# Patient Record
Sex: Female | Born: 1990 | Race: White | Hispanic: No | Marital: Single | Smoking: Never smoker
Health system: Southern US, Community
[De-identification: ages and names within clinical notes are randomized; demographics above are authoritative.]

## PROBLEM LIST (undated history)

## (undated) DIAGNOSIS — R6250 Unspecified lack of expected normal physiological development in childhood: Secondary | ICD-10-CM

## (undated) DIAGNOSIS — F79 Unspecified intellectual disabilities: Secondary | ICD-10-CM

## (undated) HISTORY — DX: Unspecified intellectual disabilities: F79

## (undated) HISTORY — DX: Unspecified lack of expected normal physiological development in childhood: R62.50

---

## 2012-12-24 ENCOUNTER — Ambulatory Visit (INDEPENDENT_AMBULATORY_CARE_PROVIDER_SITE_OTHER): Payer: Medicaid Other

## 2012-12-24 DIAGNOSIS — R55 Syncope and collapse: Secondary | ICD-10-CM

## 2012-12-24 NOTE — Procedures (Signed)
[facility]  EEG (ELECTROENCEPHALOGRAM) REPORT   STUDY DATE: 12/24/2012 PATIENT: Asani Deniston DOB: 01/27/1991 MRN: [id number]  ORDERING CLINICIAN: Donzetta Sprung, MD / Joycelyn Schmid, MD  TECHNOLOGIST: Gearldine Shown TECHNIQUE: Electroencephalogram was recorded utilizing standard 10-20 system of lead placement and reformatted into average and bipolar montages.  RECORDING TIME: 23 minutes ACTIVATION: hyperventilation attempted but patient would not cooperate; photic stimulation was performed  CLINICAL INFORMATION: syncope  FINDINGS: Background rhythms of 9-10 hertz and 60-70 microvolts. No focal, lateralizing, epileptiform activity or seizures are seen. Patient record in the awake and drowsy states.  IMPRESSION: Normal EEG in the awake and drowsy states.   INTERPRETING PHYSICIAN:   Suanne Marker, MD 12/24/2012, 5:48 PM Certified in Neurology, Neurophysiology and Neuroimaging  [facility] [address], [address] [place], [place] [address] [phone]

## 2013-01-07 ENCOUNTER — Encounter: Payer: Self-pay | Admitting: Diagnostic Neuroimaging

## 2013-01-07 ENCOUNTER — Ambulatory Visit (INDEPENDENT_AMBULATORY_CARE_PROVIDER_SITE_OTHER): Payer: Medicaid Other | Admitting: Diagnostic Neuroimaging

## 2013-01-07 VITALS — BP 108/74 | HR 88 | Temp 97.6°F | Ht 60.0 in | Wt 106.5 lb

## 2013-01-07 DIAGNOSIS — R6250 Unspecified lack of expected normal physiological development in childhood: Secondary | ICD-10-CM | POA: Insufficient documentation

## 2013-01-07 DIAGNOSIS — R569 Unspecified convulsions: Secondary | ICD-10-CM

## 2013-01-07 NOTE — Patient Instructions (Addendum)
Check MRI brain. No anti-seizure meds for now. EEG was normal. CT was normal.

## 2013-01-07 NOTE — Progress Notes (Signed)
[facility]  PATIENT: Madison Grant DOB: November 23, 1990  REFERRING CLINICIAN: Daniel HISTORY FROM: patent's mother REASON FOR VISIT: seizure   HISTORICAL  CHIEF COMPLAINT:  Chief Complaint  Patient presents with  . Seizures    HISTORY OF PRESENT ILLNESS:   22 year old right-handed female, born [redacted] weeks gestation, with low birth weight, failure to thrive, developmental delay, here for valuation of new-onset seizure. 11/26/2012, patient was at home with her cousins and siblings, and was not feeling good that day. Apparently she collapsed and passed out. Her mother came into the room and found her passed out, arms extended in front of her with generalized convulsions. No tongue biting or incontinence. Patient was confused afterwards. She was dizzy as well. She went to [facility] for evaluation with CT of the head and lab testing. Patient was diagnosed with urinary tract infection discharged home.  No further events. No prior seizure like episodes. Patient did have history of recurrent syncope earlier in life, one episode every 4-6 weeks. Those episode consisted of loss of consciousness and falling down. No convulsions or postictal confusion. No family history of seizure.  Patient also has history of chronic intermittent nausea and vomiting. Workup so far has been unrevealing.  Patient has been worked up for developmental delay in [place], with neurology and pediatric evaluation, but no specific diagnosis.  REVIEW OF SYSTEMS: Full 14 system review of systems performed and notable only for weight loss, anemia, memory loss, confusion, slurred speech, dizziness, seizure, passing out, depression, anxiety, decreased energy, change in appetite.  ALLERGIES: Not on File  HOME MEDICATIONS: No outpatient prescriptions prior to visit.   No facility-administered medications prior to visit.    PAST MEDICAL HISTORY: Past Medical History  Diagnosis Date  . Mental  disability   . Developmental delay     PAST SURGICAL HISTORY: History reviewed. No pertinent past surgical history.  FAMILY HISTORY: Family History  Problem Relation Age of Onset  . Migraines    . Fibromyalgia    . Rheum arthritis      SOCIAL HISTORY:  History   Social History  . Marital Status: Single    Spouse Name: N/A    Number of Children: 0  . Years of Education: HS   Occupational History  . Not on file.   Social History Main Topics  . Smoking status: Never Smoker   . Smokeless tobacco: Not on file  . Alcohol Use: No  . Drug Use: No  . Sexually Active: Not on file   Other Topics Concern  . Not on file   Social History Narrative   Pt lives at home with her siblings, and 3 cousins.    Caffeine use-none     PHYSICAL EXAM  Filed Vitals:   01/07/13 1205  BP: 108/74  Pulse: 88  Temp: 97.6 F (36.4 C)  TempSrc: Oral  Height: 5' (1.524 m)  Weight: 106 lb 8 oz (48.308 kg)   Body mass index is 20.8 kg/(m^2).  GENERAL EXAM: Patient is in no distress  CARDIOVASCULAR: Regular rate and rhythm, no murmurs, no carotid bruits  NEUROLOGIC: MENTAL STATUS: awake, alert, PLEASANT, DECR FLUENCY, FOLLOWS COMMANDS. SOFT SPOKEN. CRANIAL NERVE: no papilledema on fundoscopic exam, pupils equal and reactive to light, visual fields full to confrontation, extraocular muscles intact, no nystagmus, facial sensation and strength symmetric, uvula midline, shoulder shrug symmetric, tongue midline. MOTOR: normal bulk and tone, full strength in the BUE, BLE SENSORY: normal and symmetric to light touch COORDINATION: finger-nose-finger,  fine finger movements normal REFLEXES: deep tendon reflexes present and symmetric GAIT/STATION: narrow based gait; able to walk tandem; romberg is negative   DIAGNOSTIC DATA (LABS, IMAGING, TESTING) - I reviewed patient records, labs, notes, testing and imaging myself where available.  No results found for this basename: WBC, HGB, HCT, MCV,  PLT   No results found for this basename: na, k, cl, co2, glucose, bun, creatinine, calcium, prot, albumin, ast, alt, alkphos, bilitot, gfrnonaa, gfraa   No results found for this basename: CHOL, HDL, LDLCALC, LDLDIRECT, TRIG, CHOLHDL   No results found for this basename: HGBA1C   No results found for this basename: VITAMINB12   No results found for this basename: TSH   11/26/12 CT HEAD - normal  12/20/12 EEG - normal; 9-10 hertz   ASSESSMENT AND PLAN  22 y.o. year old female  has a past medical history of Mental disability and Developmental delay. here with new onset seizure x 1 on 11/26/12 in the setting of urinary tract infection. CT head, EEG, neurologic examination are unremarkable. I will check MRI of the brain for further evaluation. No antiseizure medication for now. If patient has a second seizure, then I will start antiseizure medication at that point. Patient's mother and patient agree with the plan.  Orders Placed This Encounter  Procedures  . MR Brain W Wo Contrast     Suanne Marker, MD 01/07/2013, 12:30 PM Certified in Neurology, Neurophysiology and Neuroimaging  [facility] [address], [address] [place], [place] [address] [phone]

## 2015-08-15 ENCOUNTER — Emergency Department (HOSPITAL_COMMUNITY)
Admission: EM | Admit: 2015-08-15 | Discharge: 2015-08-15 | Disposition: A | Discharge status: Home / Self Care | Payer: Medicaid Other | Attending: Emergency Medicine | Admitting: Emergency Medicine

## 2015-08-15 ENCOUNTER — Emergency Department (HOSPITAL_COMMUNITY): Payer: Medicaid Other

## 2015-08-15 ENCOUNTER — Encounter (HOSPITAL_COMMUNITY): Payer: Self-pay | Admitting: Emergency Medicine

## 2015-08-15 DIAGNOSIS — S53104A Unspecified dislocation of right ulnohumeral joint, initial encounter: Secondary | ICD-10-CM

## 2015-08-15 DIAGNOSIS — S82401A Unspecified fracture of shaft of right fibula, initial encounter for closed fracture: Secondary | ICD-10-CM | POA: Diagnosis not present

## 2015-08-15 DIAGNOSIS — Y9351 Activity, roller skating (inline) and skateboarding: Secondary | ICD-10-CM | POA: Insufficient documentation

## 2015-08-15 DIAGNOSIS — Y998 Other external cause status: Secondary | ICD-10-CM | POA: Diagnosis not present

## 2015-08-15 DIAGNOSIS — S53024A Posterior dislocation of right radial head, initial encounter: Secondary | ICD-10-CM | POA: Insufficient documentation

## 2015-08-15 DIAGNOSIS — F79 Unspecified intellectual disabilities: Secondary | ICD-10-CM | POA: Diagnosis not present

## 2015-08-15 DIAGNOSIS — Y9289 Other specified places as the place of occurrence of the external cause: Secondary | ICD-10-CM | POA: Insufficient documentation

## 2015-08-15 DIAGNOSIS — G8918 Other acute postprocedural pain: Secondary | ICD-10-CM

## 2015-08-15 DIAGNOSIS — S6991XA Unspecified injury of right wrist, hand and finger(s), initial encounter: Secondary | ICD-10-CM | POA: Diagnosis present

## 2015-08-15 MED ORDER — MORPHINE SULFATE (PF) 2 MG/ML IV SOLN
2.0000 mg | Freq: Once | INTRAVENOUS | Status: AC
  Administered 2015-08-15: 2 mg via INTRAVENOUS
  Filled 2015-08-15: qty 1

## 2015-08-15 MED ORDER — HYDROCODONE-ACETAMINOPHEN 5-325 MG PO TABS: 2.0000 | ORAL_TABLET | Freq: Four times a day (QID) | ORAL | Status: AC | PRN

## 2015-08-15 MED ORDER — PROPOFOL 10 MG/ML IV BOLUS
1.0000 mg/kg | Freq: Once | INTRAVENOUS | Status: AC
  Administered 2015-08-15: 40 mg via INTRAVENOUS
  Filled 2015-08-15: qty 20

## 2015-08-15 MED ORDER — FREE SPIRIT KNEE/LEG WALKER MISC: 1.0000 | Freq: Once | Status: AC

## 2015-08-15 MED ORDER — SODIUM CHLORIDE 0.9 % IV SOLN
INTRAVENOUS | Status: AC | PRN
  Administered 2015-08-15: 150 mL/h via INTRAVENOUS

## 2015-08-15 MED ORDER — PROPOFOL 10 MG/ML IV BOLUS
INTRAVENOUS | Status: AC | PRN
  Administered 2015-08-15: 20 mg via INTRAVENOUS

## 2015-08-15 NOTE — ED Notes (Addendum)
Fell [place] skating rink; reports right elbow pain. EMS reports severe pain with movement and seemed concave at joint. Immobilized by EMS. 4mg  Morphine given in route. Patient has mental disability with mental abilities of about 24 years old; mother is at bedside with patient. Also reports pain in the right ankle, swelling noted.

## 2015-08-15 NOTE — ED Provider Notes (Signed)
CSN: [id number]     Arrival date & time 08/15/15  1556 History   First MD Initiated Contact with Patient 08/15/15 1621     Chief Complaint  Patient presents with  . Arm Injury     (Consider location/radiation/quality/duration/timing/severity/associated sxs/prior Treatment) HPI   Madison Grant is a 24 y.o. female with PMH significant for mental disability and developmental delay who presents with fall.  Patient was skating earlier this afternoon and lost her balance falling on outstretched right arm.  Denies LOC, syncope, dizziness, lightheadedness at the time of the fall.  Denies head injury.  She is complaining of severe persistent right elbow pain and right ankle pain.  She denies numbness, tingling, or weakness.  No other complaints at this time.  She received 4 mg PTA per EMS.  It is made worse with movement.   Past Medical History  Diagnosis Date  . Mental disability   . Developmental delay    History reviewed. No pertinent past surgical history. Family History  Problem Relation Age of Onset  . Migraines    . Fibromyalgia    . Rheum arthritis     Social History  Substance Use Topics  . Smoking status: Never Smoker   . Smokeless tobacco: None  . Alcohol Use: No   OB History    No data available     Review of Systems All other systems negative unless otherwise stated in HPI    Allergies  Review of patient's allergies indicates no known allergies.  Home Medications   Prior to Admission medications   Medication Sig Start Date End Date Taking? Authorizing Provider  HYDROcodone-acetaminophen (NORCO/VICODIN) 5-325 MG tablet Take 2 tablets by mouth every 6 (six) hours as needed. 08/15/15   Cheri FowlerKayla Buffie Herne, PA-C  Misc. Devices (FREE SPIRIT KNEE/LEG WALKER) MISC 1 Device by Does not apply route once. 08/15/15   Lyndal Pulleyaniel Knott, MD   BP 118/70 mmHg  Pulse 88  Temp(Src) 98.4 F (36.9 C) (Oral)  Resp 18  SpO2 99%  LMP 08/02/2015 (Approximate) Physical Exam  Constitutional:  She is oriented to person, place, and time. She appears well-developed and well-nourished.  HENT:  Head: Normocephalic and atraumatic.  Mouth/Throat: Oropharynx is clear and moist.  Eyes: Conjunctivae are normal.  Neck: Normal range of motion. Neck supple.  Cardiovascular: Normal rate, regular rhythm and normal heart sounds.   No murmur heard. Pulses:      Radial pulses are 2+ on the right side, and 2+ on the left side.       Dorsalis pedis pulses are 2+ on the right side, and 2+ on the left side.  Capillary refill less than 3 seconds distal to injuries.   Pulmonary/Chest: Effort normal and breath sounds normal. No accessory muscle usage or stridor. No respiratory distress. She has no wheezes. She has no rhonchi. She has no rales.  Abdominal: Soft. Bowel sounds are normal. She exhibits no distension. There is no tenderness.  Musculoskeletal:       Right elbow: She exhibits decreased range of motion, swelling and deformity. Tenderness found.       Left elbow: Normal.       Right ankle: She exhibits decreased range of motion and swelling. She exhibits no ecchymosis, no deformity, no laceration and normal pulse. Tenderness. Achilles tendon normal.       Left ankle: Normal.  Compartments are soft and compressible.   Lymphadenopathy:    She has no cervical adenopathy.  Neurological: She is alert and oriented to  person, place, and time.  Speech clear without dysarthria.  Sensation intact distal to injuries.  5/5 grip strength bilaterally.  Decreased strength in right ankle secondary to pain.  She is able to move all 5 digits of the right foot.   Skin: Skin is warm and dry.  Psychiatric: She has a normal mood and affect. Her behavior is normal.    ED Course  Procedures (including critical care time) Labs Review Labs Reviewed - No data to display  Imaging Review Dg Elbow 2 Views Right  08/15/2015  CLINICAL DATA:  Postreduction. EXAM: RIGHT ELBOW - 2 VIEW COMPARISON:  Level radiographs from  the same day. FINDINGS: The elbow has been reduced. A moderate-sized joint effusion remains. No focal fracture is evident. IMPRESSION: 1. Reduction of the dislocation. 2. Moderate-sized joint effusion persist. 3. No acute fracture. Electronically Signed   By: Marin Roberts M.D.   On: 08/15/2015 20:28   Dg Elbow Complete Right  08/15/2015  CLINICAL DATA:  Elbow pain after a fall while roller-skating today. EXAM: RIGHT ELBOW - COMPLETE 3+ VIEW COMPARISON:  None. FINDINGS: There is posterior dislocation of the proximal radius and ulna with over riding of the bones. However, there is no visible fracture. IMPRESSION: Dislocation of the radius and ulna at the elbow joint as described. Electronically Signed   By: Francene Boyers M.D.   On: 08/15/2015 17:26   Dg Ankle Complete Right  08/15/2015  CLINICAL DATA:  Ankle pain secondary to a twisting fall on roller-skates today. EXAM: RIGHT ANKLE - COMPLETE 3+ VIEW COMPARISON:  None. FINDINGS: There is a slightly displaced spiral fracture of the distal shaft of the right fibula. No other abnormality. IMPRESSION: Spiral fracture of distal right fibula shaft. Electronically Signed   By: Francene Boyers M.D.   On: 08/15/2015 17:25   I have personally reviewed and evaluated these images and lab results as part of my medical decision-making.   EKG Interpretation None      MDM   Final diagnoses:  Post procedure discomfort  Elbow dislocation, right, initial encounter  Fibula fracture, right, closed, initial encounter    Patient presents after mechanical while skating landing on her right ankle and elbow.  VSS, NAD.  Heart RRR, lungs CTAB, abdomen soft and nontender.  Right elbow with deformity and swelling, limited ROM.  Right ankle swelling and TTP medially and laterally.  Neurovascularly intact distal to both injuries.    Plain films of right elbow shows dislocation of radius and ulna.  Plain films of ankle shows spiral fx of distal right fibula.  Will  perform reduction of elbow and apply splint to ankle.  Post reduction plain films of right elbow show reduction of dislocation and moderate joint effusion, no signs of fracture.  Will place in sling.   Prescription for scooter from medical supplies store.  Case manager not available at this time. Consult ordered. Patient to return to ED to speak with case management if unable to obtain scooter.  Follow up ortho.  Norco for pain management.  Evaluation does not show pathology requring ongoing emergent intervention or admission. Pt is hemodynamically stable and mentating appropriately. Discussed findings/results and plan with patient/guardian, who agrees with plan. All questions answered. Return precautions discussed and outpatient follow up given.   Case has been discussed with and seen by Dr. Clydene Pugh who agrees with the above plan for discharge.   Cheri Fowler, PA-C 08/15/15 2300  Lyndal Pulley, MD 08/18/15 [phone]

## 2015-08-15 NOTE — Progress Notes (Signed)
Orthopedic Tech Progress Note Patient Details:  Madison ConradiVivian Grant 11-Apr-1991 [id number] Applied sling immobilizer to RUE.  Applied fiberglass posterior short leg splint and fiberglass stirrup splint to RLE.  Pedal pulses, sensation, motion intact before and after splinting.  Capillary refill less than 2 seconds before and after splinting. Ortho Devices Type of Ortho Device: Sling immobilizer, Stirrup splint, Post (short leg) splint Ortho Device/Splint Location: Sling to RUE.  Splint to RLE. Ortho Device/Splint Interventions: Application   Lesle ChrisGilliland, Betheny Suchecki L 08/15/2015, 9:39 PM

## 2015-08-15 NOTE — ED Notes (Signed)
Patient returned from X-ray 

## 2015-08-15 NOTE — ED Notes (Signed)
Ortho tech at the bedside.  

## 2015-08-15 NOTE — Discharge Instructions (Signed)
Cast or Splint Care °Casts and splints support injured limbs and keep bones from moving while they heal. It is important to care for your cast or splint at home.   °HOME CARE INSTRUCTIONS °· Keep the cast or splint uncovered during the drying period. It can take 24 to 48 hours to dry if it is made of plaster. A fiberglass cast will dry in less than 1 hour. °· Do not rest the cast on anything harder than a pillow for the first 24 hours. °· Do not put weight on your injured limb or apply pressure to the cast until your health care provider gives you permission. °· Keep the cast or splint dry. Wet casts or splints can lose their shape and may not support the limb as well. A wet cast that has lost its shape can also create harmful pressure on your skin when it dries. Also, wet skin can become infected. °· Cover the cast or splint with a plastic bag when bathing or when out in the rain or snow. If the cast is on the trunk of the body, take sponge baths until the cast is removed. °· If your cast does become wet, dry it with a towel or a blow dryer on the cool setting only. °· Keep your cast or splint clean. Soiled casts may be wiped with a moistened cloth. °· Do not place any hard or soft foreign objects under your cast or splint, such as cotton, toilet paper, lotion, or powder. °· Do not try to scratch the skin under the cast with any object. The object could get stuck inside the cast. Also, scratching could lead to an infection. If itching is a problem, use a blow dryer on a cool setting to relieve discomfort. °· Do not trim or cut your cast or remove padding from inside of it. °· Exercise all joints next to the injury that are not immobilized by the cast or splint. For example, if you have a long leg cast, exercise the hip joint and toes. If you have an arm cast or splint, exercise the shoulder, elbow, thumb, and fingers. °· Elevate your injured arm or leg on 1 or 2 pillows for the first 1 to 3 days to decrease  swelling and pain. It is best if you can comfortably elevate your cast so it is higher than your heart. °SEEK MEDICAL CARE IF:  °· Your cast or splint cracks. °· Your cast or splint is too tight or too loose. °· You have unbearable itching inside the cast. °· Your cast becomes wet or develops a soft spot or area. °· You have a bad smell coming from inside your cast. °· You get an object stuck under your cast. °· Your skin around the cast becomes red or raw. °· You have new pain or worsening pain after the cast has been applied. °SEEK IMMEDIATE MEDICAL CARE IF:  °· You have fluid leaking through the cast. °· You are unable to move your fingers or toes. °· You have discolored (blue or white), cool, painful, or very swollen fingers or toes beyond the cast. °· You have tingling or numbness around the injured area. °· You have severe pain or pressure under the cast. °· You have any difficulty with your breathing or have shortness of breath. °· You have chest pain. °  °This information is not intended to replace advice given to you by your health care provider. Make sure you discuss any questions you have with your health care   provider.   Document Released: 09/22/2000 Document Revised: 07/16/2013 Document Reviewed: 04/03/2013 [employer] Interactive Patient Education 2016 [employer]  Elbow Dislocation Elbow dislocation is the displacement of the bones that form the elbow joint. Three bones come together to form the elbow. The humerus is the bone in the upper arm. The radius and ulna are the 2 bones in the forearm that form the lower part of the elbow. The elbow is held in place by very strong, fibrous tissues (ligaments) that connect the bones to each other. CAUSES Elbow dislocations are not common. Typically, they occur when a person falls forward with hands and elbows outstretched. The force of the impact is sent to the elbow. Usually, there is a twisting motion in this force. Elbow dislocations also happen  during car crashes when passengers reach out to brace themselves during the impact. RISK FACTORS Although dislocation of the elbow can happen to anyone, some people are at greater risk than others. People at increased risk of elbow dislocation include:  People born with greater looseness in their ligaments.  People born with an ulna bone that has a shallow groove for the elbow hinge joint. SYMPTOMS Symptoms of a complete elbow dislocation usually are obvious. They include extreme pain and the appearance of a deformed arm.  Symptoms of a partial dislocation may not be obvious. Your elbow may move somewhat, but you may have pain and swelling. Also, there will likely be bruising on the inside and outside of your elbow where ligaments have been stretched or torn.  DIAGNOSIS  To diagnose elbow dislocation, your caregiver will perform a physical exam. During this exam, your caregiver will check your arm for tenderness, swelling, and deformity. The skin around your arm and the circulation in your arm also will be checked. Your pulse will be checked at your wrist. If your artery is injured during dislocation, your hand will be cool to the touch and may be white or purple in color. Your caregiver also may check your arm and your ability to move your wrist and fingers to see if you had any damage to your nerves during dislocation. An X-ray exam also may be done to determine if there is bone injury. Results of an X-ray exam can help show the direction of the dislocation. If you have a simple dislocation, there is no major bone injury. If you have a complex dislocation, you may have broken bones (fractures) associated with the ligament injuries. TREATMENT For a simple elbow dislocation, your bones can usually be realigned in a procedure called a reduction. This is a treatment in which your bones are manually moved back into place either with the use of numbing medicine (regional anesthetic) around your elbow or  medicine to make you sleep (general anesthetic). Then your elbow is kept immobile with a sling or a splint for 2 to 3 weeks. This is followed with physical therapy to help your joint move again. Complex elbow dislocation may require surgery to restore joint alignment and repair ligaments. After surgery, your elbow may be protected with an external hinge. This device keeps your elbow from dislocating again while motion exercises are done. Additional surgery may be needed to repair any injuries to blood vessels and nerves or bones and ligaments or to relieve pressure from excessive swelling around the muscles. HOME CARE INSTRUCTIONS The following measures can help to reduce pain and hasten the healing process:  Rest your injured joint. Do not move it. Avoid activities similar to the one that  caused your injury.  Exercise your hand and fingers as instructed by your caregiver.  Apply ice to your injured joint for 1 to 2 days after your reduction or as directed by your caregiver. Applying ice helps to reduce inflammation and pain.  Put ice in a plastic bag.  Place a towel between your skin and the bag.  Leave the ice on for 15 to 20 minutes at a time, every couple of hours while you are awake.  Elevate your arm above your heart and move your wrist and fingers as instructed by your caregiver to help limit swelling.  Take over-the-counter or prescription medicines for pain as directed by your caregiver. SEEK IMMEDIATE MEDICAL CARE IF:  Your splint becomes damaged.  You have an external hinge and it becomes loose or will not move.  You have an external hinge and you develop drainage around the pins.  Your pain becomes worse rather than better.  You lose feeling in your hand or fingers. MAKE SURE YOU:  Understand these instructions.  Will watch your condition.  Will get help right away if you are not doing well or get worse.   This information is not intended to replace advice given to  you by your health care provider. Make sure you discuss any questions you have with your health care provider.   Document Released: 09/19/2001 Document Revised: 10/16/2014 Document Reviewed: 05/10/2015 [employer] Interactive Patient Education 2016 [employer]  Fibular Ankle Fracture Treated With or Without Immobilization, Adult A fibular fracture at your ankle is a break (fracture) bone in the smallest of the two bones in your lower leg, located on the outside of your leg (fibula) close to the area at your ankle joint. CAUSES  Rolling your ankle.  Twisting your ankle.  Extreme flexing or extending of your foot.  Severe force on your ankle as when falling from a distance. RISK FACTORS  Jumping activities.  Participation in sports.  Osteoporosis.  Advanced age.  Previous ankle injuries. SIGNS AND SYMPTOMS  Pain.  Swelling.  Inability to put weight on injured ankle.  Bruising.  Bone deformities at site of injury. DIAGNOSIS  This fracture is diagnosed with the help of an X-ray exam. TREATMENT  If the fractured bone did not move out of place it usually will heal without problems and does casting or splinting. If immobilization is needed for comfort or the fractured bone moved out of place and will not heal properly with immobilization, a cast or splint will be used. HOME CARE INSTRUCTIONS   Apply ice to the area of injury:  Put ice in a plastic bag.  Place a towel between your skin and the bag.  Leave the ice on for 20 minutes, 2-3 times a day.  Use crutches as directed. Resume walking without crutches as directed by your health care provider.  Only take over-the-counter or prescription medicines for pain, discomfort, or fever as directed by your health care provider.  If you have a removable splint or boot, do not remove the boot unless directed by your health care provider. SEEK MEDICAL CARE IF:   You have continued pain or more swelling  The medications do  not control the pain. SEEK IMMEDIATE MEDICAL CARE IF:  You develop severe pain in the leg or foot.  Your skin or nails below the injury turn blue or grey or feel cold or numb. MAKE SURE YOU:   Understand these instructions.  Will watch your condition.  Will get help right away  if you are not doing well or get worse.   This information is not intended to replace advice given to you by your health care provider. Make sure you discuss any questions you have with your health care provider.   Document Released: 09/25/2005 Document Revised: 10/16/2014 Document Reviewed: 05/07/2013 Elsevier Interactive Patient Education [employer]

## 2015-08-15 NOTE — ED Notes (Signed)
Attempt to ambulate with sling and crutches unsuccessful; MD Knott notified

## 2017-03-27 ENCOUNTER — Encounter: Payer: Self-pay | Admitting: *Deleted

## 2017-03-27 ENCOUNTER — Other Ambulatory Visit: Payer: Self-pay | Admitting: Adult Health

## 2017-07-01 IMAGING — CR DG ELBOW COMPLETE 3+V*R*
5 series · 5 of 5 positions shown · non-contrast
Comparison: None.

CLINICAL DATA: Elbow pain after a fall while roller-skating today.

EXAM:
RIGHT ELBOW - COMPLETE 3+ VIEW

[elbow ap]
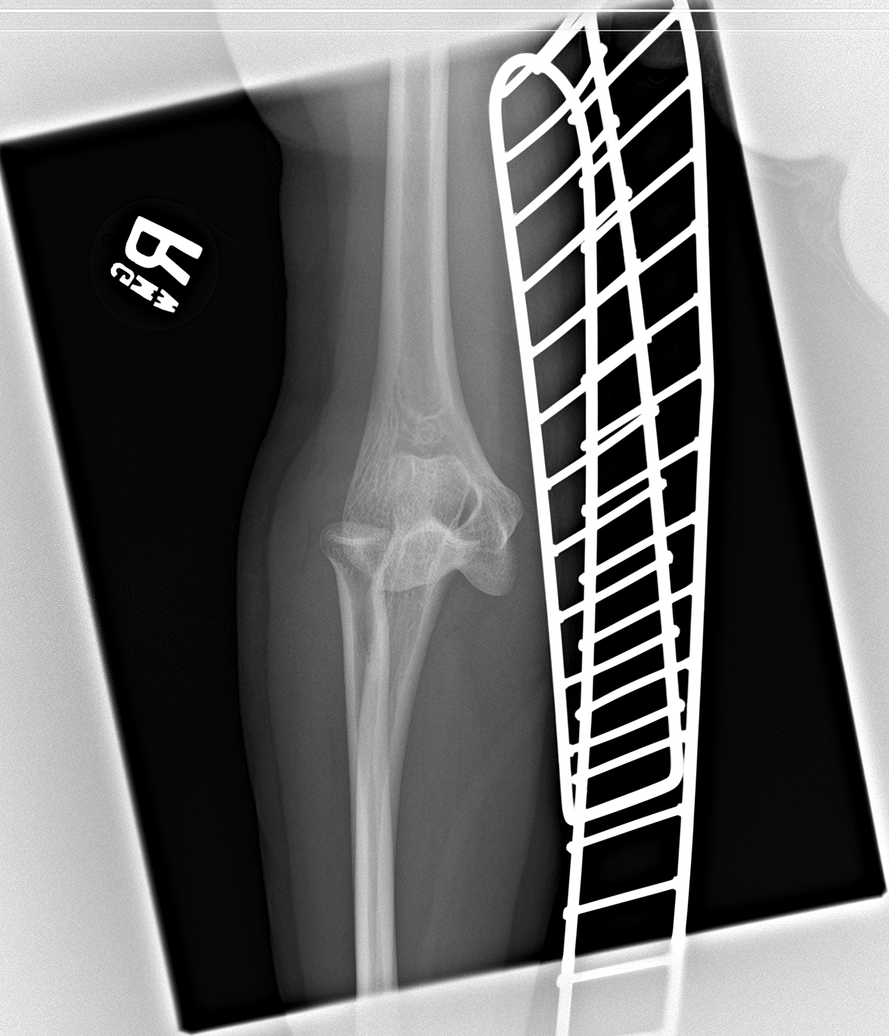

[elbow obl (1 of 2)]
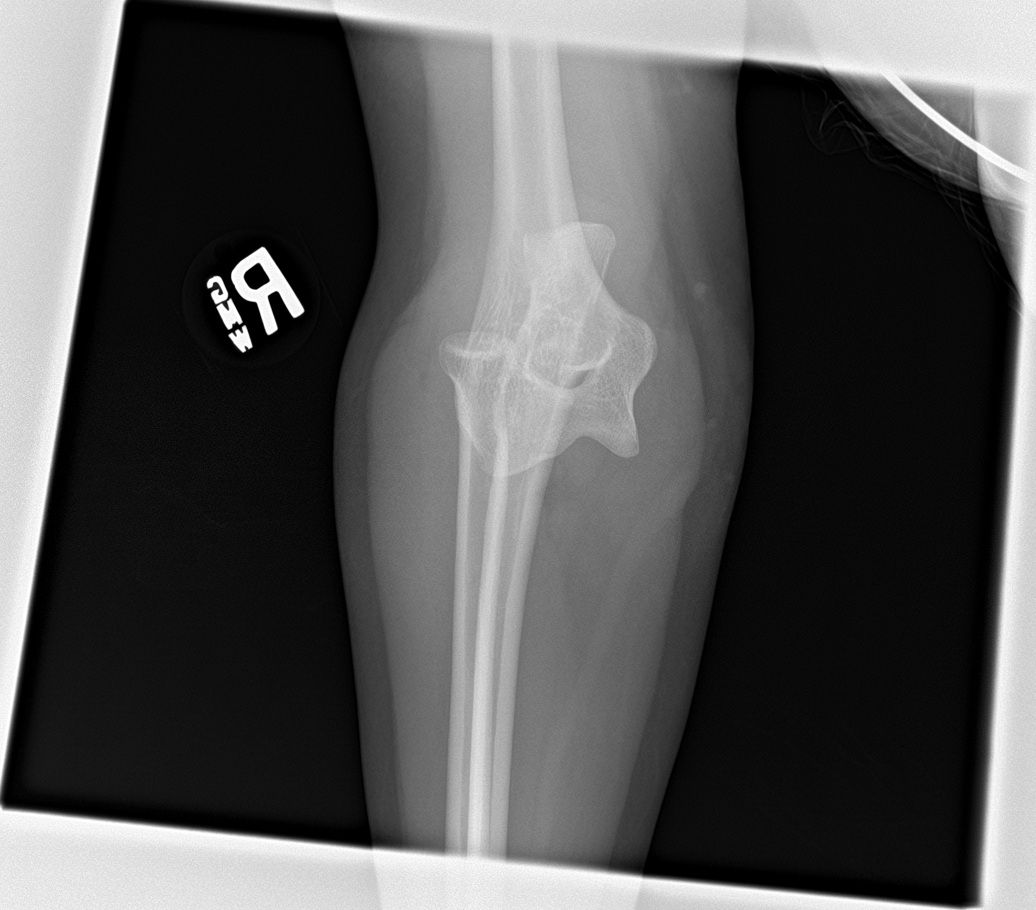

[elbow obl (2 of 2)]
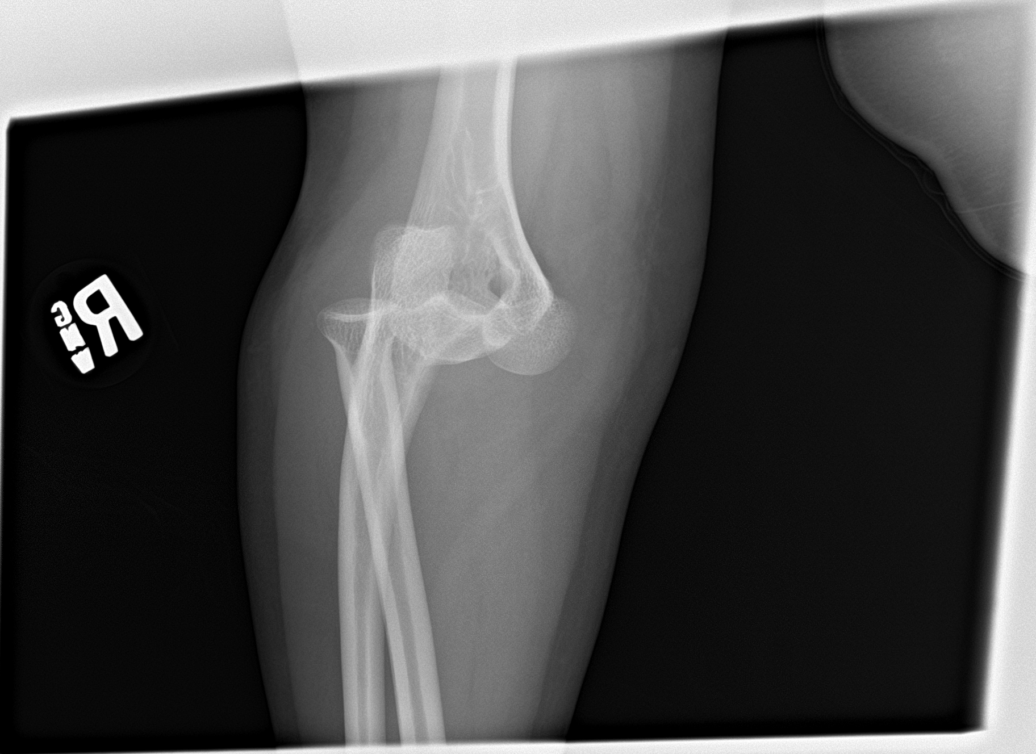

[elbow lat (1 of 2)]
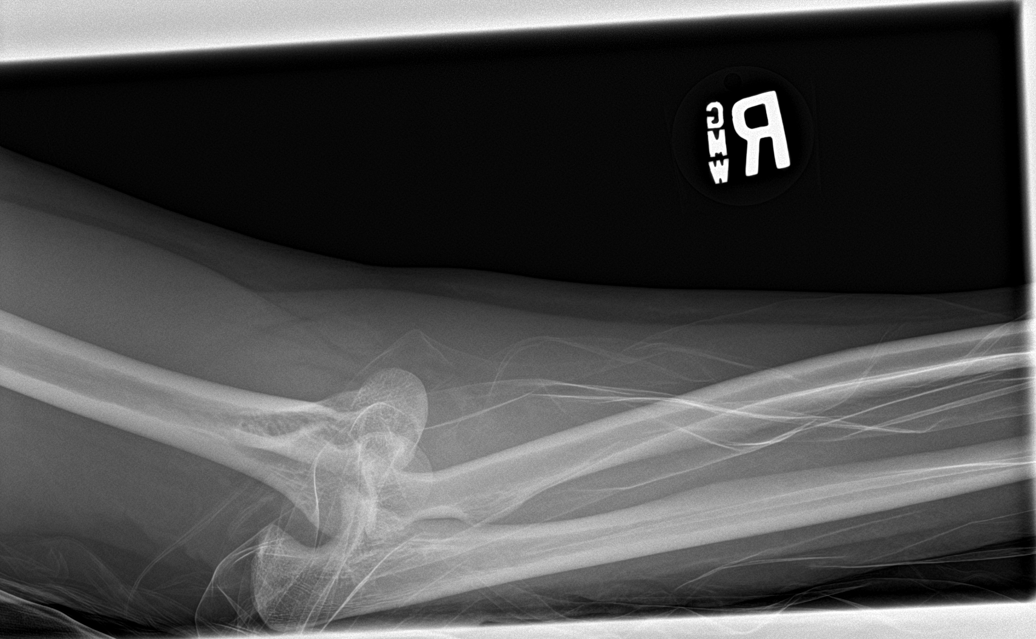

[elbow lat (2 of 2)]
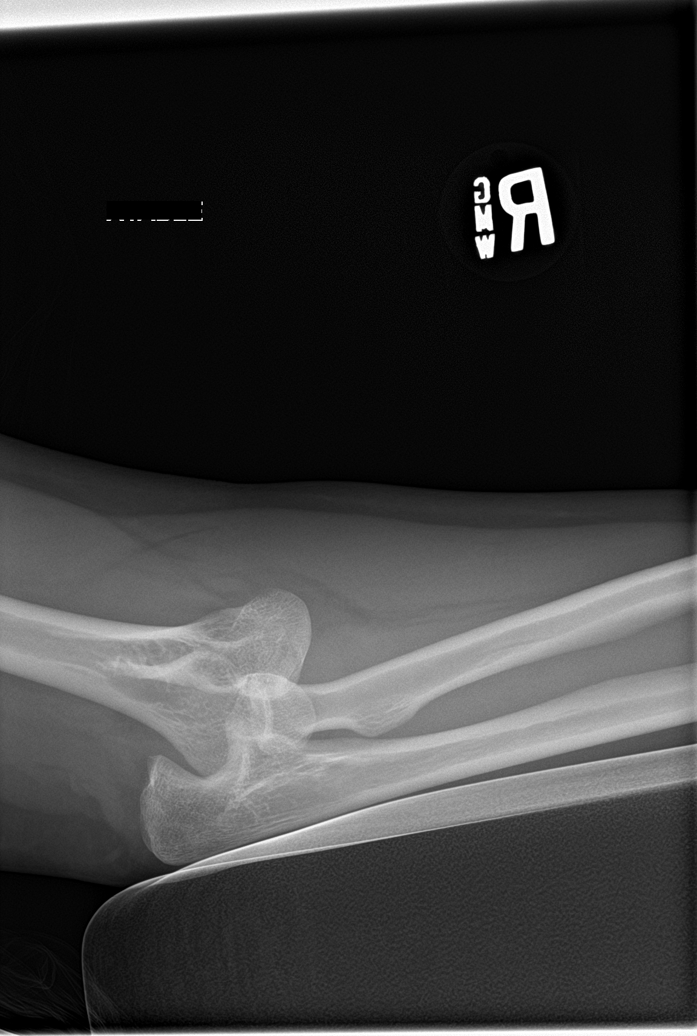

[5 of 5 positions shown; findings below may reference images not displayed]

FINDINGS: There is posterior dislocation of the proximal radius and ulna with
over riding of the bones. However, there is no visible fracture.
IMPRESSION: Dislocation of the radius and ulna at the elbow joint as described.
